# Patient Record
Sex: Female | Born: 1995 | Race: White | Hispanic: No | Marital: Single | Smoking: Never smoker
Health system: Southern US, Community
[De-identification: ages and names within clinical notes are randomized; demographics above are authoritative.]

## PROBLEM LIST (undated history)

## (undated) DIAGNOSIS — B2790 Infectious mononucleosis, unspecified without complication: Secondary | ICD-10-CM

## (undated) DIAGNOSIS — B974 Respiratory syncytial virus as the cause of diseases classified elsewhere: Secondary | ICD-10-CM

## (undated) DIAGNOSIS — B338 Other specified viral diseases: Secondary | ICD-10-CM

---
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
## 2013-08-16 IMAGING — CT CT ABD-PELV W/ CM
2 of 4 series · 16 of 46 positions shown, 18 images · IV contrast (agent unspecified)
Comparison: None

CLINICAL DATA: 15-year-old female with right abdominal, flank and
pelvic pain.

CT ABDOMEN AND PELVIS WITH CONTRAST
TECHNIQUE: Multidetector CT imaging of the abdomen and pelvis was
performed following the standard protocol during bolus
administration of intravenous contrast.
Contrast:  100 ml intravenous Dmnipaque-OZZ

[Series 2: abd/pelvis 5.0 b31f · axial · 0.78mm/px · z∈[-530,-105]mm · 13 of 93 slices shown, 15 images]
[im 4/93  soft-tissue]
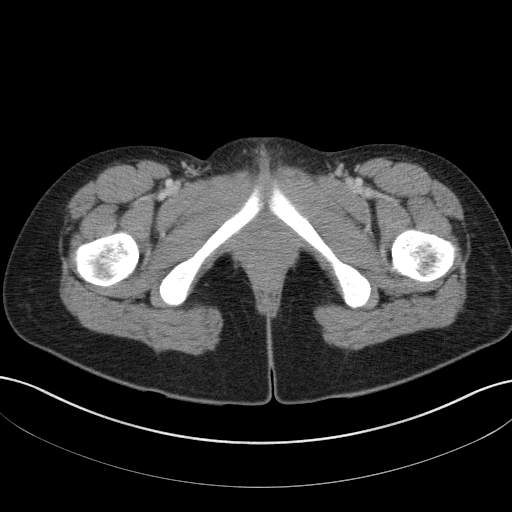
[im 4/93  bone]
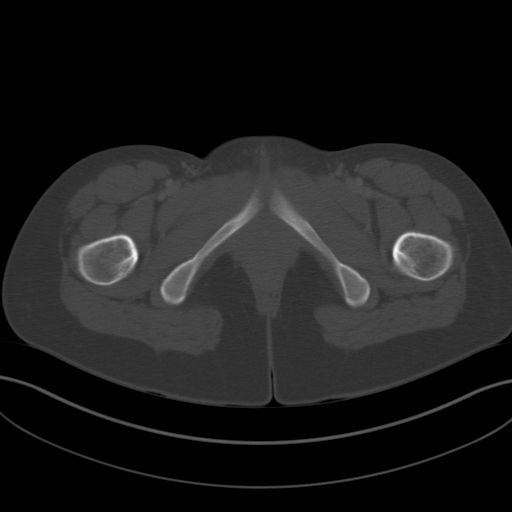
[im 11/93  soft-tissue]
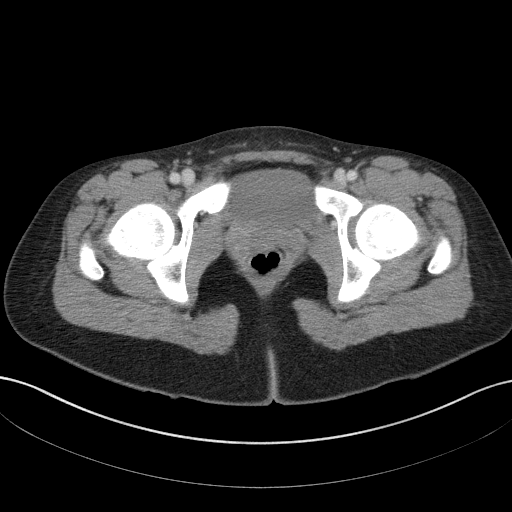
[im 18/93  soft-tissue]
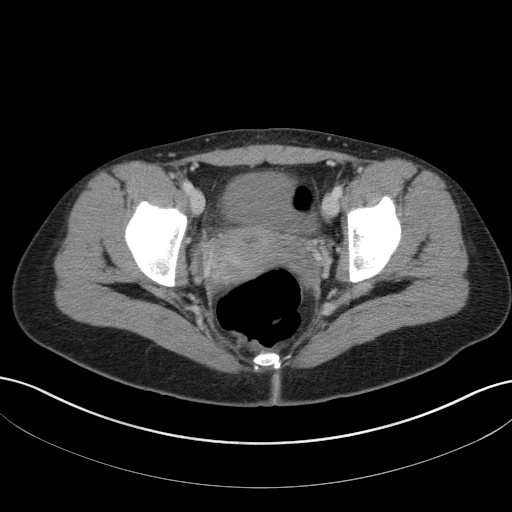
[im 25/93  soft-tissue]
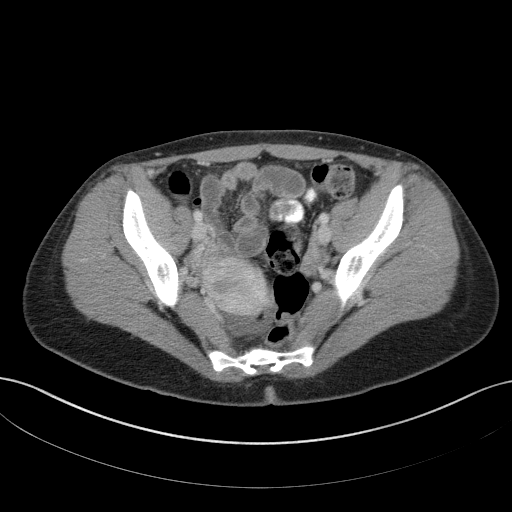
[im 32/93  soft-tissue]
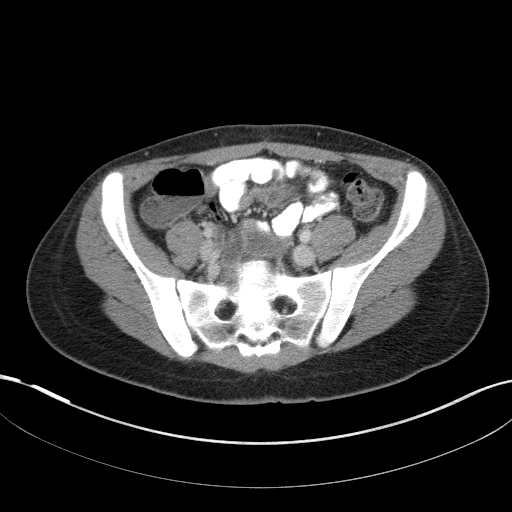
[im 39/93  soft-tissue]
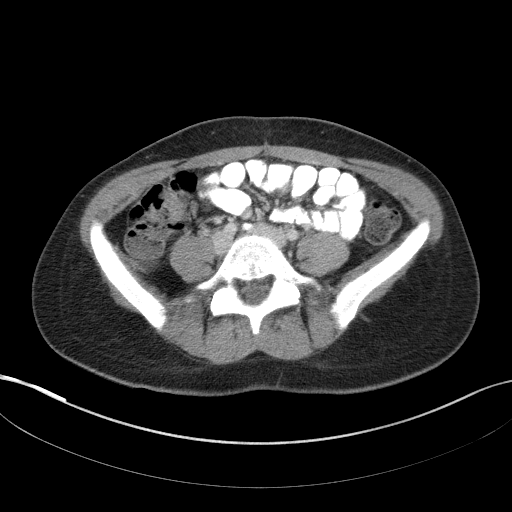
[im 47/93  soft-tissue]
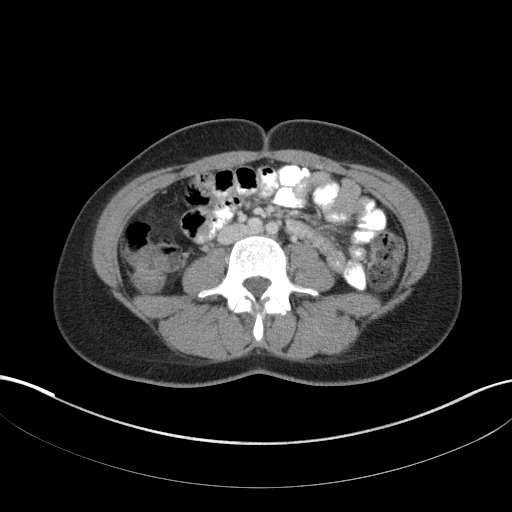
[im 54/93  soft-tissue]
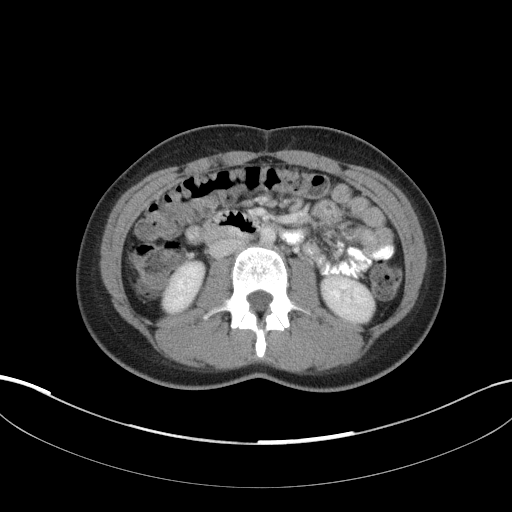
[im 61/93  soft-tissue]
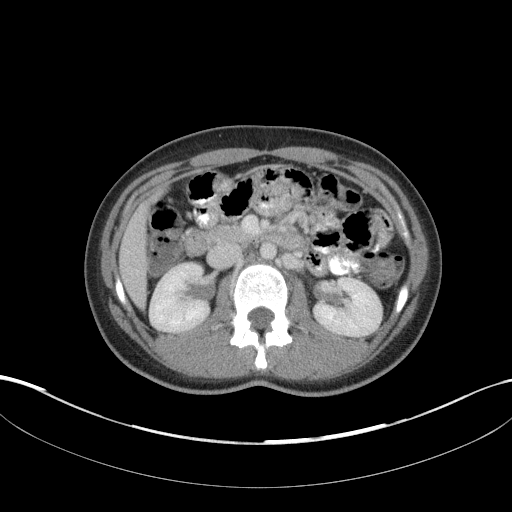
[im 61/93  bone]
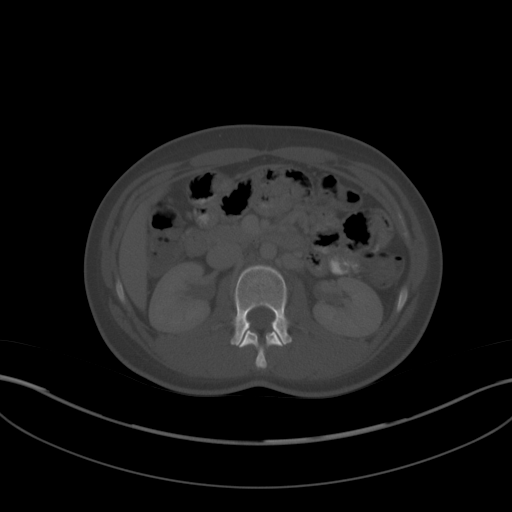
[im 68/93  soft-tissue]
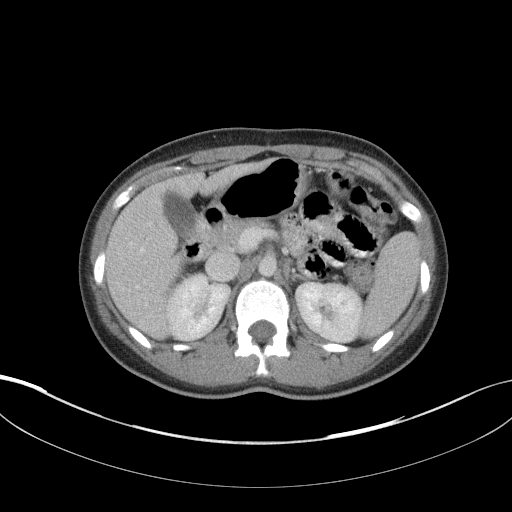
[im 75/93  soft-tissue]
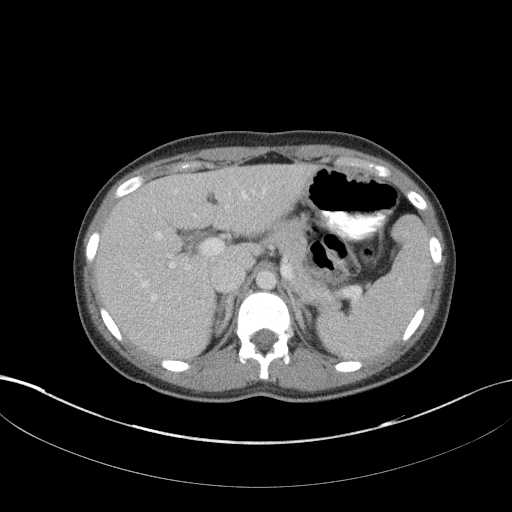
[im 82/93  soft-tissue]
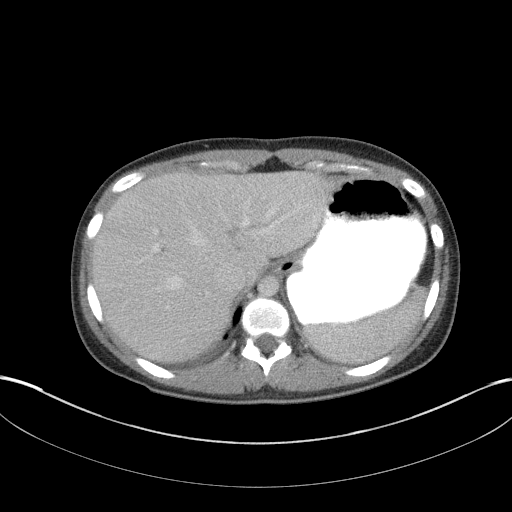
[im 89/93  soft-tissue]
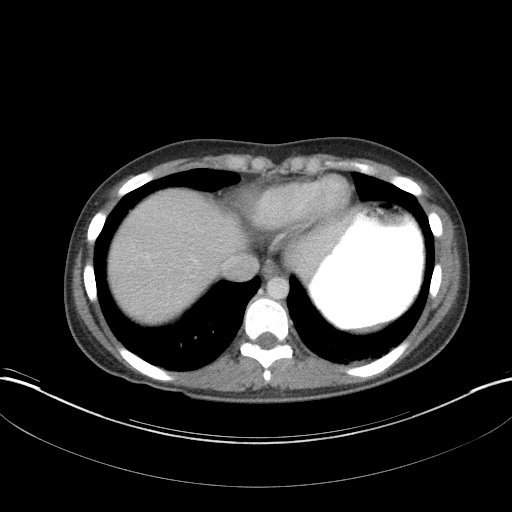

[Series 5: abd/pelvis 3.0 coronal · coronal · 0.65mm/px · 3 of 68 slices shown]
[im 23/68  soft-tissue]
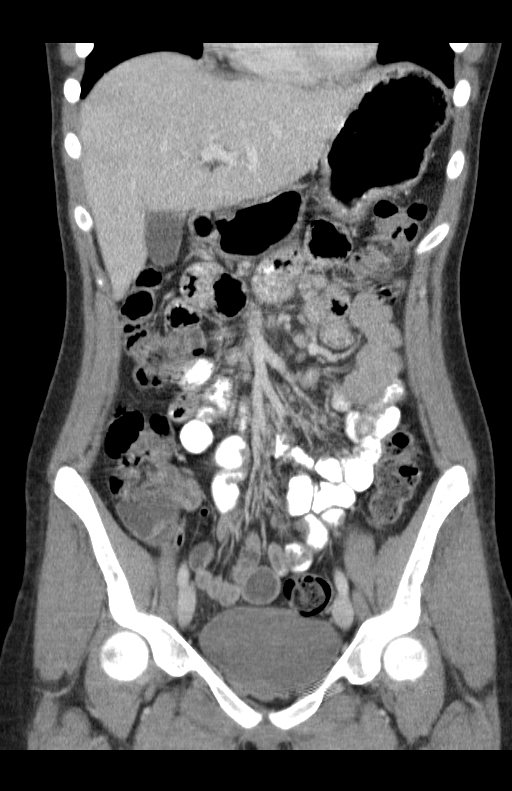
[im 30/68  soft-tissue]
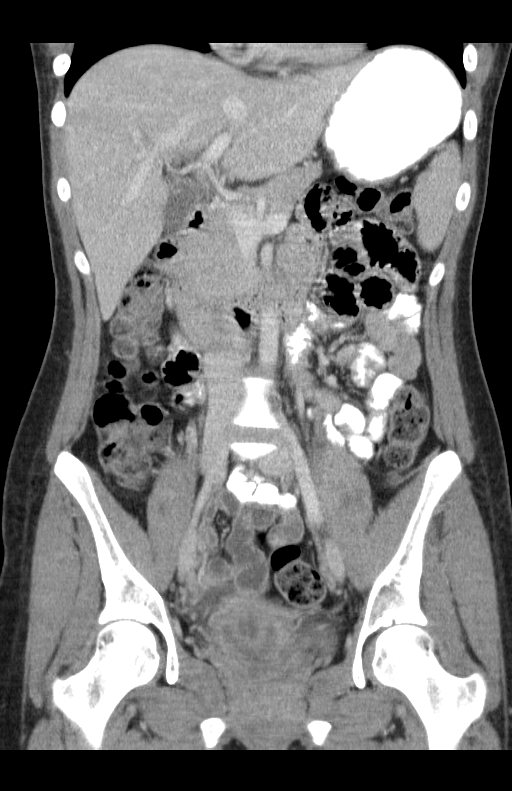
[im 38/68  soft-tissue]
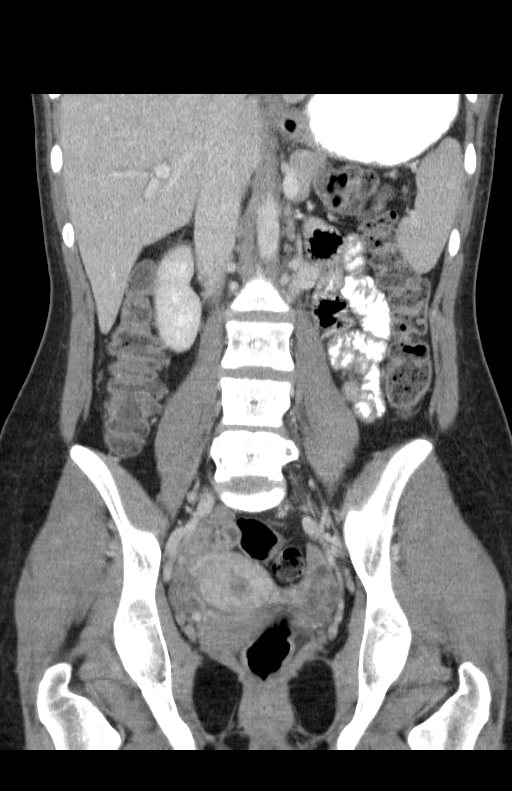

[16 of 46 positions shown; findings below may reference images not displayed]

FINDINGS: The liver, spleen, adrenal glands, gallbladder, pancreas
and kidneys are unremarkable.

A trace amount of free fluid within the pelvis is noted.
There is equivocal mild wall thickening of a few nondistended small
bowel loops and may represent an enteritis.
There is no evidence of bowel obstruction or pneumoperitoneum.
The appendix and bladder are unremarkable.
There is no evidence of enlarged lymph nodes, abdominal aortic
aneurysm or biliary dilatation.

No acute or suspicious bony abnormalities are noted.
IMPRESSION: Equivocal mild wall thickening of nondistended small bowel loops -
possibly representing enteritis.

Normal appendix.

Trace amount of free fluid which may be reactive or physiologic.

## 2018-09-26 ENCOUNTER — Emergency Department (INDEPENDENT_AMBULATORY_CARE_PROVIDER_SITE_OTHER)
Admission: EM | Admit: 2018-09-26 | Discharge: 2018-09-26 | Disposition: A | Discharge status: Home / Self Care | Payer: 59 | Source: Home / Self Care | Attending: Emergency Medicine | Admitting: Emergency Medicine

## 2018-09-26 ENCOUNTER — Encounter: Payer: Self-pay | Admitting: Emergency Medicine

## 2018-09-26 ENCOUNTER — Other Ambulatory Visit: Payer: Self-pay

## 2018-09-26 DIAGNOSIS — L03011 Cellulitis of right finger: Secondary | ICD-10-CM | POA: Diagnosis not present

## 2018-09-26 MED ORDER — CEPHALEXIN 500 MG PO CAPS: 500.0000 mg | ORAL_CAPSULE | Freq: Three times a day (TID) | ORAL | 0 refills | Status: AC

## 2018-09-26 NOTE — ED Triage Notes (Signed)
Patient is 3 weeks postpartum/cesarean section; she is breast feeding.

## 2018-09-26 NOTE — ED Provider Notes (Signed)
[facility]    CSN: [id number] Arrival date & time: 09/26/18  1336     History   Chief Complaint Chief Complaint  Patient presents with  . Hand Pain    right index finger    HPI Amber Townsend is a 22 y.o. female.   HPI Complains of right distal finger pain and redness and swelling for 1 day.  Prior to that, did not have any finger pain or redness or injury or rash. The pain is throbbing at times, 5 out of 10 at rest, can increase to 8 out of 10 with pressing on it or if she dangles her hand downward. Associated symptoms: Denies fever or chills.  No headache or ENT symptoms.  No chest pain or shortness of breath or palpitations.  No abdominal pain or nausea or vomiting or change of bowel habits.  No lightheadedness or focal neurologic symptoms.  Past medical history of note, she is 4 weeks postpartum and is lactating and breast-feeding. Prior to delivery 4 weeks ago, she had preeclampsia and DIC.  Her CBC, renal functions, and liver tests normalized prior to discharge from hospital and she is following up with her OB/GYN soon she states.  Baby is doing well, born at approximately [redacted] weeks gestation. Past Medical History:  Diagnosis Date  . Asthma   . Mononucleosis   . RSV (respiratory syncytial virus infection)     There are no active problems to display for this patient.   Past Surgical History:  Procedure Laterality Date  . CESAREAN SECTION      OB History   None      Home Medications    Prior to Admission medications   Medication Sig Start Date End Date Taking? Authorizing Provider  labetalol (NORMODYNE) 200 MG tablet Take 200 mg by mouth 2 (two) times daily.   Yes [provider]  Prenatal Multivit-Min-Fe-FA (PRENATAL VITAMINS PO) Take by mouth.   Yes [provider]  venlafaxine XR (EFFEXOR-XR) 150 MG 24 hr capsule Take 150 mg by mouth daily with breakfast.   Yes [provider]  cephALEXin (KEFLEX) 500 MG capsule Take 1  capsule (500 mg total) by mouth 3 (three) times daily. 09/26/18   Lajean Manes, MD    Family History No family history on file.  Social History Social History   Tobacco Use  . Smoking status: Never Smoker  Substance Use Topics  . Alcohol use: No  . Drug use: No     Allergies   Penicillins   Review of Systems Review of Systems  Respiratory: Negative for shortness of breath and wheezing.   Cardiovascular: Negative for chest pain.  All other systems reviewed and are negative.  Pertinent items noted in HPI and remainder of comprehensive ROS otherwise negative.   Physical Exam Triage Vital Signs ED Triage Vitals  Enc Vitals Group     BP 09/26/18 1419 100/71     Pulse Rate 09/26/18 1419 69     Resp 09/26/18 1419 16     Temp 09/26/18 1419 98.5 F (36.9 C)     Temp Source 09/26/18 1419 Oral     SpO2 09/26/18 1419 97 %     Weight 09/26/18 1420 177 lb (80.3 kg)     Height 09/26/18 1420 5\' 5"  (1.651 m)     Head Circumference --      Peak Flow --      Pain Score 09/26/18 1420 7     Pain Loc --  Pain Edu? --      Excl. in GC? --    No data found.  Updated Vital Signs BP 100/71 (BP Location: Left Arm)   Pulse 69   Temp 98.5 F (36.9 C) (Oral)   Resp 16   Ht 5\' 5"  (1.651 m)   Wt 80.3 kg   SpO2 97%   BMI 29.45 kg/m    Physical Exam  Constitutional: She is oriented to person, place, and time. She appears well-developed and well-nourished. No distress.  HENT:  Head: Normocephalic and atraumatic.  Eyes: Pupils are equal, round, and reactive to light. No scleral icterus.  Neck: Normal range of motion. Neck supple.  Cardiovascular: Normal rate and regular rhythm.  Pulmonary/Chest: Effort normal.  Abdominal: She exhibits no distension.  Neurological: She is alert and oriented to person, place, and time. No cranial nerve deficit.  Skin: Skin is warm and dry.  Psychiatric: She has a normal mood and affect. Her behavior is normal.  Vitals reviewed.  Right  index finger: Distal one quarter of finger is diffusely, mildly swollen red, warm, indurated, tender.  Sensation intact.  Capillary refill intact.  No fluctuance or drainage.  Finger nail appears normal.  No wound or skin irregularity otherwise.  Cuticle feels and appears normal.  No bony tenderness.  Range of motion and tendons tested, within normal limits.  UC Treatments / Results  Labs (all labs ordered are listed, but only abnormal results are displayed) Labs Reviewed - No data to display  EKG None yes  Radiology No results found.  Procedures Procedures (including critical care time)  Medications Ordered in UC Medications - No data to display  Initial Impression / Assessment and Plan / UC Course  I have reviewed the triage vital signs and the nursing notes.  Pertinent labs & imaging results that were available during my care of the patient were reviewed by me and considered in my medical decision making (see chart for details). Absolutely     Final Clinical Impressions(s) / UC Diagnoses  Cellulitis distal right index finger.  No evidence of fluctuance or abscess.  No evidence of neurovascular compromise.   Final diagnoses:  Cellulitis of right index finger  Treatment options discussed, as well as risks, benefits, alternatives.  Patient voiced understanding and agreement with the following plans:   Discharge Instructions     You have a finger infection of the soft tissue, there is no sign of abscess at this time. Treatment is strong antibiotic, warm soaks in Epsom salts 3 times a day, and keeping your finger elevated. Be on the look out for signs of pockets of pus, which would require incision and drainage. If any worsening or severe symptoms, return here or emergency room immediately.  Otherwise, follow-up with your PCP within 5 to 7 days.   ED Prescriptions    Medication Sig Dispense Auth. Provider   cephALEXin (KEFLEX) 500 MG capsule Take 1 capsule (500 mg total)  by mouth 3 (three) times daily. 30 capsule Lajean Manes, MD    She declined any pain medication prescription. Other advice given An After Visit Summary was printed and given to the patient. Precautions discussed. Red flags discussed.-Emergency room if any red flag Questions invited and answered. Patient voiced understanding and agreement.  Controlled Substance Prescriptions [place] Controlled Substance Registry consulted? Not Applicable   Lajean Manes, MD 09/26/18 [phone]

## 2018-09-26 NOTE — Discharge Instructions (Addendum)
You have a finger infection of the soft tissue, there is no sign of abscess at this time. Treatment is strong antibiotic, warm soaks in Epsom salts 3 times a day, and keeping your finger elevated. Be on the look out for signs of pockets of pus, which would require incision and drainage. If any worsening or severe symptoms, return here or emergency room immediately.  Otherwise, follow-up with your PCP within 5 to 7 days.

## 2018-09-26 NOTE — ED Triage Notes (Signed)
Awoke with painful, reddened right index fingertip; no known injury or insect bite. No recent OTC.
